# Patient Record
Sex: Female | Born: 2000 | Race: Black or African American | Hispanic: No | Marital: Single | Smoking: Never smoker
Health system: Southern US, Community
[De-identification: ages and names within clinical notes are randomized; demographics above are authoritative.]

## PROBLEM LIST (undated history)

## (undated) DIAGNOSIS — T7840XA Allergy, unspecified, initial encounter: Secondary | ICD-10-CM

## (undated) HISTORY — DX: Allergy, unspecified, initial encounter: T78.40XA

---
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
## 2018-08-31 ENCOUNTER — Other Ambulatory Visit: Payer: Self-pay | Admitting: Physician Assistant

## 2019-04-23 ENCOUNTER — Other Ambulatory Visit: Payer: Self-pay | Admitting: Physician Assistant

## 2019-05-26 ENCOUNTER — Other Ambulatory Visit: Payer: Self-pay | Admitting: Physician Assistant

## 2019-06-01 ENCOUNTER — Other Ambulatory Visit: Payer: Self-pay | Admitting: Physician Assistant

## 2019-08-28 ENCOUNTER — Other Ambulatory Visit: Payer: Self-pay | Admitting: Physician Assistant

## 2021-06-25 ENCOUNTER — Other Ambulatory Visit: Payer: Self-pay | Admitting: Physician Assistant

## 2022-02-22 ENCOUNTER — Ambulatory Visit
Admission: EM | Admit: 2022-02-22 | Discharge: 2022-02-22 | Disposition: A | Discharge status: Home / Self Care | Payer: Managed Care, Other (non HMO) | Attending: Internal Medicine | Admitting: Internal Medicine

## 2022-02-22 DIAGNOSIS — R300 Dysuria: Secondary | ICD-10-CM | POA: Diagnosis present

## 2022-02-22 DIAGNOSIS — N898 Other specified noninflammatory disorders of vagina: Secondary | ICD-10-CM

## 2022-02-22 DIAGNOSIS — Z113 Encounter for screening for infections with a predominantly sexual mode of transmission: Secondary | ICD-10-CM | POA: Diagnosis present

## 2022-02-22 LAB — POCT URINALYSIS DIP (MANUAL ENTRY)
Bilirubin, UA: NEGATIVE
Blood, UA: NEGATIVE
Glucose, UA: NEGATIVE mg/dL
Ketones, POC UA: NEGATIVE mg/dL
Nitrite, UA: NEGATIVE
Protein Ur, POC: NEGATIVE mg/dL
Spec Grav, UA: 1.025 (ref 1.010–1.025)
Urobilinogen, UA: 1 E.U./dL
pH, UA: 6 (ref 5.0–8.0)

## 2022-02-22 LAB — POCT URINE PREGNANCY: Preg Test, Ur: NEGATIVE

## 2022-02-22 MED ORDER — DOXYCYCLINE HYCLATE 100 MG PO CAPS: 100.0000 mg | ORAL_CAPSULE | Freq: Two times a day (BID) | ORAL | 0 refills | Status: AC

## 2022-02-22 MED ORDER — CEFTRIAXONE SODIUM 500 MG IJ SOLR
500.0000 mg | Freq: Once | INTRAMUSCULAR | Status: AC
  Administered 2022-02-22: 500 mg via INTRAMUSCULAR

## 2022-02-22 NOTE — ED Triage Notes (Signed)
Pt c/o dysuria, vaginal discharge (watery)  Denies frequency, hematuria, vaginal odor, pelvic pain, back pain  Onset ~ 4 days

## 2022-02-22 NOTE — Discharge Instructions (Signed)
Your urine do not show signs of urinary tract infection.  Urine pregnancy was negative.  You are being prophylactically treated for possible STD exposure with antibiotic shot in urgent care as well as antibiotic pills sent over to pharmacy.  These will treat for gonorrhea and chlamydia if you were exposed.  Your vaginal swab and urine culture pending.  We will call if they are positive and provide any further treatment if necessary.  Please refrain from sexual activity until test results and treatment are complete.

## 2022-02-22 NOTE — ED Provider Notes (Signed)
[facility]    CSN: [id number] Arrival date & time: 02/22/22  0840      History   Chief Complaint Chief Complaint  Patient presents with   Dysuria    HPI Marcia Clark is a 21 y.o. female.   Patient presents with dysuria and vaginal discharge that started about 4 days ago.  Vaginal discharge is clear to white in color.  Dysuria occurred 1 day and has now resolved.  Denies urinary frequency, hematuria, abnormal vaginal bleeding, abdominal pain, fever, pelvic pain, back pain.  Patient denies any known exposure to STD but does report that her recent sexual partner has penile discharge.  Last menstrual cycle was approximately 1 to 2 weeks ago.   Dysuria  Past Medical History:  Diagnosis Date   Allergy    Asthma     There are no problems to display for this patient.   History reviewed. No pertinent surgical history.  OB History   No obstetric history on file.      Home Medications    Prior to Admission medications   Medication Sig Start Date End Date Taking? Authorizing Provider  doxycycline (VIBRAMYCIN) 100 MG capsule Take 1 capsule (100 mg total) by mouth 2 (two) times daily. 02/22/22  Yes Deeanna Beightol, Acie FredricksonHaley E, FNP  levonorgestrel-ethinyl estradiol (NORDETTE) 0.15-30 MG-MCG tablet Take 1 tablet by mouth daily. 01/01/22  Yes [provider]  famotidine (PEPCID) 40 MG tablet Take 40 mg by mouth daily. 12/10/21   [provider]    Family History Family History  Problem Relation Age of Onset   Cancer Maternal Aunt    Hypertension Paternal Uncle    Arthritis Paternal Grandmother    Diabetes Paternal Grandmother    Hypertension Paternal Grandmother    Arthritis Paternal Grandfather    Hypertension Mother     Social History Social History   Tobacco Use   Smoking status: Passive Smoke Exposure - Never Smoker  Substance Use Topics   Alcohol use: No   Drug use: No     Allergies   Other   Review of Systems Review of Systems Per  HPI  Physical Exam Triage Vital Signs ED Triage Vitals [02/22/22 0948]  Enc Vitals Group     BP 121/87     Pulse Rate 84     Resp 18     Temp 98.6 F (37 C)     Temp Source Oral     SpO2 98 %     Weight      Height      Head Circumference      Peak Flow      Pain Score 0     Pain Loc      Pain Edu?      Excl. in [place]?    No data found.  Updated Vital Signs BP 121/87 (BP Location: Left Arm)   Pulse 84   Temp 98.6 F (37 C) (Oral)   Resp 18   SpO2 98%   Visual Acuity Right Eye Distance:   Left Eye Distance:   Bilateral Distance:    Right Eye Near:   Left Eye Near:    Bilateral Near:     Physical Exam Constitutional:      General: She is not in acute distress.    Appearance: Normal appearance. She is not toxic-appearing or diaphoretic.  HENT:     Head: Normocephalic and atraumatic.  Eyes:     Extraocular Movements: Extraocular movements intact.  Conjunctiva/sclera: Conjunctivae normal.  Cardiovascular:     Rate and Rhythm: Normal rate and regular rhythm.     Pulses: Normal pulses.     Heart sounds: Normal heart sounds.  Pulmonary:     Effort: Pulmonary effort is normal. No respiratory distress.     Breath sounds: Normal breath sounds.  Abdominal:     General: Bowel sounds are normal. There is no distension.     Palpations: Abdomen is soft.     Tenderness: There is no abdominal tenderness.  Genitourinary:    Comments: Deferred with shared decision making.  Self swab performed. Neurological:     General: No focal deficit present.     Mental Status: She is alert and oriented to person, place, and time. Mental status is at baseline.  Psychiatric:        Mood and Affect: Mood normal.        Behavior: Behavior normal.        Thought Content: Thought content normal.        Judgment: Judgment normal.     UC Treatments / Results  Labs (all labs ordered are listed, but only abnormal results are displayed) Labs Reviewed  POCT URINALYSIS DIP (MANUAL  ENTRY) - Abnormal; Notable for the following components:      Result Value   Leukocytes, UA Trace (*)    All other components within normal limits  URINE CULTURE  POCT URINE PREGNANCY    EKG   Radiology No results found.  Procedures Procedures (including critical care time)  Medications Ordered in UC Medications  cefTRIAXone (ROCEPHIN) injection 500 mg (500 mg Intramuscular Given 02/22/22 1032)    Initial Impression / Assessment and Plan / UC Course  I have reviewed the triage vital signs and the nursing notes.  Pertinent labs & imaging results that were available during my care of the patient were reviewed by me and considered in my medical decision making (see chart for details).     There is concern for STD exposure given that her sexual partner has had penile discharge and similar symptoms.  Will opt to treat prophylactically with IM Rocephin and doxycycline to cover for gonorrhea and chlamydia exposure.  Urinalysis not indicative of urinary tract infection.  Will send urine culture and cervicovaginal swab.  Will await results for any further treatment.  Patient to refrain from sexual activity until test results and treatment are complete.  Discussed return precautions.  Patient verbalized understanding and was agreeable with plan. Final Clinical Impressions(s) / UC Diagnoses   Final diagnoses:  Vaginal discharge  Screening examination for venereal disease  Dysuria     Discharge Instructions      Your urine do not show signs of urinary tract infection.  Urine pregnancy was negative.  You are being prophylactically treated for possible STD exposure with antibiotic shot in urgent care as well as antibiotic pills sent over to pharmacy.  These will treat for gonorrhea and chlamydia if you were exposed.  Your vaginal swab and urine culture pending.  We will call if they are positive and provide any further treatment if necessary.  Please refrain from sexual activity until test  results and treatment are complete.    ED Prescriptions     Medication Sig Dispense Auth. Provider   doxycycline (VIBRAMYCIN) 100 MG capsule Take 1 capsule (100 mg total) by mouth 2 (two) times daily. 20 capsule Gustavus Bryant, [place]      PDMP not reviewed this encounter.   Parkin,  Acie Fredrickson, FNP 02/22/22 1035

## 2022-02-23 LAB — URINE CULTURE: Culture: <10000 — AB

## 2022-02-24 LAB — CERVICOVAGINAL ANCILLARY ONLY
Bacterial Vaginitis (gardnerella): NEGATIVE
Candida Glabrata: NEGATIVE
Candida Vaginitis: POSITIVE — AB
Chlamydia: NEGATIVE
Comment: NEGATIVE
Comment: NEGATIVE
Comment: NEGATIVE
Comment: NEGATIVE
Comment: NEGATIVE
Comment: NORMAL
Neisseria Gonorrhea: NEGATIVE
Trichomonas: NEGATIVE

## 2022-02-25 ENCOUNTER — Telehealth (HOSPITAL_COMMUNITY): Payer: Self-pay | Admitting: Emergency Medicine

## 2022-02-25 MED ORDER — FLUCONAZOLE 150 MG PO TABS: 150.0000 mg | ORAL_TABLET | Freq: Once | ORAL | 0 refills | Status: AC

## 2022-07-25 ENCOUNTER — Encounter: Payer: Self-pay | Admitting: Emergency Medicine

## 2022-07-25 ENCOUNTER — Ambulatory Visit
Admission: EM | Admit: 2022-07-25 | Discharge: 2022-07-25 | Disposition: A | Discharge status: Home / Self Care | Payer: Managed Care, Other (non HMO) | Attending: Physician Assistant | Admitting: Physician Assistant

## 2022-07-25 DIAGNOSIS — N760 Acute vaginitis: Secondary | ICD-10-CM | POA: Insufficient documentation

## 2022-07-25 DIAGNOSIS — Z1152 Encounter for screening for COVID-19: Secondary | ICD-10-CM | POA: Diagnosis present

## 2022-07-25 DIAGNOSIS — R52 Pain, unspecified: Secondary | ICD-10-CM | POA: Insufficient documentation

## 2022-07-25 NOTE — ED Provider Notes (Signed)
[facility]    CSN: [id number] Arrival date & time: 07/25/22  1522      History   Chief Complaint Chief Complaint  Patient presents with   Generalized Body Aches   hot sweats    Laceration    HPI Marcia Clark is a 21 y.o. female.   Patient here today for evaluation of generalized body aches and hot sweats.  She denies any congestion, cough or sore throat.  She has not had any vomiting or diarrhea.  She has not taken any medication for symptoms.  She also reports a "cut" to the bottom of her vaginal opening.  She states this occurred during sex about a week ago.  She notes she has had "tears "in the past but nothing that has not healed within a few days.  The history is provided by the patient.  Laceration Associated symptoms: no fever     Past Medical History:  Diagnosis Date   Allergy    Asthma     There are no problems to display for this patient.   History reviewed. No pertinent surgical history.  OB History   No obstetric history on file.      Home Medications    Prior to Admission medications   Medication Sig Start Date End Date Taking? Authorizing Provider  doxycycline (VIBRAMYCIN) 100 MG capsule Take 1 capsule (100 mg total) by mouth 2 (two) times daily. 02/22/22   Teodora Medici, FNP  famotidine (PEPCID) 40 MG tablet Take 40 mg by mouth daily. 12/10/21   [provider]  levonorgestrel-ethinyl estradiol (NORDETTE) 0.15-30 MG-MCG tablet Take 1 tablet by mouth daily. 01/01/22   [provider]    Family History Family History  Problem Relation Age of Onset   Cancer Maternal Aunt    Hypertension Paternal Uncle    Arthritis Paternal Grandmother    Diabetes Paternal Grandmother    Hypertension Paternal Grandmother    Arthritis Paternal Grandfather    Hypertension Mother     Social History Social History   Tobacco Use   Smoking status: Passive Smoke Exposure - Never Smoker  Substance Use Topics   Alcohol use: No    Drug use: No     Allergies   Other   Review of Systems Review of Systems  Constitutional:  Positive for diaphoresis. Negative for chills and fever.  HENT:  Negative for congestion and sore throat.   Eyes:  Negative for discharge and redness.  Respiratory:  Negative for cough and shortness of breath.   Gastrointestinal:  Negative for abdominal pain, diarrhea and vomiting.  Musculoskeletal:  Negative for myalgias.  Skin:  Negative for wound.     Physical Exam Triage Vital Signs ED Triage Vitals  Enc Vitals Group     BP 07/25/22 1555 124/83     Pulse Rate 07/25/22 1555 (!) 105     Resp 07/25/22 1555 18     Temp 07/25/22 1555 98.3 F (36.8 C)     Temp src --      SpO2 07/25/22 1555 98 %     Weight --      Height --      Head Circumference --      Peak Flow --      Pain Score 07/25/22 1553 0     Pain Loc --      Pain Edu? --      Excl. in [place]? --    No data found.  Updated Vital Signs  BP 124/83   Pulse (!) 105   Temp 98.3 F (36.8 C)   Resp 18   SpO2 98%      Physical Exam Vitals and nursing note reviewed.  Constitutional:      General: She is not in acute distress.    Appearance: Normal appearance. She is not ill-appearing.  HENT:     Head: Normocephalic and atraumatic.     Nose: Nose normal. No congestion or rhinorrhea.  Eyes:     Conjunctiva/sclera: Conjunctivae normal.  Cardiovascular:     Rate and Rhythm: Normal rate.  Pulmonary:     Effort: Pulmonary effort is normal. No respiratory distress.  Genitourinary:    Comments: Mild skin tear to lower vaginal introitus  Neurological:     Mental Status: She is alert.  Psychiatric:        Mood and Affect: Mood normal.        Behavior: Behavior normal.        Thought Content: Thought content normal.      UC Treatments / Results  Labs (all labs ordered are listed, but only abnormal results are displayed) Labs Reviewed  SARS CORONAVIRUS 2 (TAT 6-24 HRS)    EKG   Radiology No results  found.  Procedures Procedures (including critical care time)  Medications Ordered in UC Medications - No data to display  Initial Impression / Assessment and Plan / UC Course  I have reviewed the triage vital signs and the nursing notes.  Pertinent labs & imaging results that were available during my care of the patient were reviewed by me and considered in my medical decision making (see chart for details).    Will screen for covid given body aches- concerns for viral illness. Recommended symptomatic treatment. Discussed avoiding sexual activity until skin tear heals- advised vaseline to help with comfort. Encouraged follow up with any further concerns.    Final Clinical Impressions(s) / UC Diagnoses   Final diagnoses:  Body aches  Inflammation of vaginal introitus  Encounter for screening for COVID-19   Discharge Instructions   None    ED Prescriptions   None    PDMP not reviewed this encounter.   Francene Finders, PA-C 07/25/22 [phone]

## 2022-07-25 NOTE — ED Triage Notes (Signed)
Pt is present today with body aches and hot sweats. Pt sx started x3 days ago.   Pt also states that she has a near her vaginal canal. Pt states that she noticed the cut last Wednesday

## 2022-07-26 LAB — SARS CORONAVIRUS 2 (TAT 6-24 HRS): SARS Coronavirus 2: NEGATIVE

## 2022-07-27 ENCOUNTER — Ambulatory Visit (HOSPITAL_COMMUNITY)
Admission: EM | Admit: 2022-07-27 | Discharge: 2022-07-27 | Disposition: A | Discharge status: Home / Self Care | Payer: Managed Care, Other (non HMO) | Attending: Emergency Medicine | Admitting: Emergency Medicine

## 2022-07-27 ENCOUNTER — Encounter (HOSPITAL_COMMUNITY): Payer: Self-pay

## 2022-07-27 DIAGNOSIS — N898 Other specified noninflammatory disorders of vagina: Secondary | ICD-10-CM | POA: Diagnosis present

## 2022-07-27 DIAGNOSIS — N9089 Other specified noninflammatory disorders of vulva and perineum: Secondary | ICD-10-CM | POA: Insufficient documentation

## 2022-07-27 LAB — CBC WITH DIFFERENTIAL/PLATELET
Abs Immature Granulocytes: 0 10*3/uL (ref 0.00–0.07)
Basophils Absolute: 0 10*3/uL (ref 0.0–0.1)
Basophils Relative: 0 %
Eosinophils Absolute: 0 10*3/uL (ref 0.0–0.5)
Eosinophils Relative: 0 %
HCT: 38.9 % (ref 36.0–46.0)
Hemoglobin: 13.4 g/dL (ref 12.0–15.0)
Lymphocytes Relative: 29 %
Lymphs Abs: 1.1 10*3/uL (ref 0.7–4.0)
MCH: 31 pg (ref 26.0–34.0)
MCHC: 34.4 g/dL (ref 30.0–36.0)
MCV: 90 fL (ref 80.0–100.0)
Monocytes Absolute: 0.4 10*3/uL (ref 0.1–1.0)
Monocytes Relative: 11 %
Neutro Abs: 2.3 10*3/uL (ref 1.7–7.7)
Neutrophils Relative %: 60 %
Platelets: 199 10*3/uL (ref 150–400)
RBC: 4.32 MIL/uL (ref 3.87–5.11)
RDW: 12.8 % (ref 11.5–15.5)
WBC: 3.8 10*3/uL — ABNORMAL LOW (ref 4.0–10.5)
nRBC: 0 % (ref 0.0–0.2)
nRBC: 0 /100 WBC

## 2022-07-27 LAB — POCT URINALYSIS DIPSTICK, ED / UC
Glucose, UA: NEGATIVE mg/dL
Hgb urine dipstick: NEGATIVE
Leukocytes,Ua: NEGATIVE
Nitrite: NEGATIVE
Protein, ur: 30 mg/dL — AB
Specific Gravity, Urine: 1.02 (ref 1.005–1.030)
Urobilinogen, UA: 2 mg/dL — ABNORMAL HIGH (ref 0.0–1.0)
pH: 6 (ref 5.0–8.0)

## 2022-07-27 LAB — POC URINE PREG, ED: Preg Test, Ur: NEGATIVE

## 2022-07-27 MED ORDER — NAPROXEN 500 MG PO TABS: 500.0000 mg | ORAL_TABLET | Freq: Two times a day (BID) | ORAL | 0 refills | Status: AC
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
## 2022-07-27 NOTE — ED Triage Notes (Signed)
Pt reports body ache and eye swelling x-2-3 days. Pt reports a cut on her vagina. This is due to sexual intercourse last week.

## 2022-07-27 NOTE — Discharge Instructions (Addendum)
Your vaginal swab and HSV swab are currently pending.  If the test results are positive we will give you a call.  The blood work is currently pending, if anything is abnormal with the blood work we will give you a call.  I have attached information for sitz bath, I recommend that you performing sitz bath at least 2 times daily.  At this time is best that you refrain from any sexual activity for the next 3 to 4 weeks.  I have attached information for the women's health care center, please call them tomorrow and schedule an appointment.   I have sent naproxen to the pharmacy, this is an anti-inflammatory medication, you can take this every 12 hours as needed.  I recommend that you have food on your stomach when you take this medication due to potential stomach upset and/or GI bleeding that can be caused by the medication.

## 2022-07-27 NOTE — ED Notes (Signed)
Chaperoned Ngozi, NP at patient bedside and examination of genital area.  Provider obtained a cytology swab and HSV swab.  Both labeled at bedside verified patient name and birth date.  Samples placed in lab

## 2022-07-28 LAB — CERVICOVAGINAL ANCILLARY ONLY
Bacterial Vaginitis (gardnerella): POSITIVE — AB
Candida Glabrata: NEGATIVE
Candida Vaginitis: NEGATIVE
Chlamydia: NEGATIVE
Comment: NEGATIVE
Comment: NEGATIVE
Comment: NEGATIVE
Comment: NEGATIVE
Comment: NEGATIVE
Comment: NORMAL
Neisseria Gonorrhea: NEGATIVE
Trichomonas: NEGATIVE

## 2022-07-29 ENCOUNTER — Telehealth (HOSPITAL_COMMUNITY): Payer: Self-pay | Admitting: Emergency Medicine

## 2022-07-29 LAB — HSV CULTURE AND TYPING

## 2022-07-29 MED ORDER — METRONIDAZOLE 500 MG PO TABS: 500.0000 mg | ORAL_TABLET | Freq: Two times a day (BID) | ORAL | 0 refills | Status: DC

## 2022-10-31 ENCOUNTER — Ambulatory Visit
Admission: RE | Admit: 2022-10-31 | Discharge: 2022-10-31 | Disposition: A | Discharge status: Home / Self Care | Payer: Managed Care, Other (non HMO) | Source: Ambulatory Visit | Attending: Internal Medicine | Admitting: Internal Medicine

## 2022-10-31 VITALS — BP 122/83 | HR 94 | Temp 98.7°F | Resp 20 | Ht 67.0 in | Wt 180.1 lb

## 2022-10-31 DIAGNOSIS — Z3202 Encounter for pregnancy test, result negative: Secondary | ICD-10-CM | POA: Diagnosis present

## 2022-10-31 DIAGNOSIS — Z113 Encounter for screening for infections with a predominantly sexual mode of transmission: Secondary | ICD-10-CM

## 2022-10-31 DIAGNOSIS — N898 Other specified noninflammatory disorders of vagina: Secondary | ICD-10-CM | POA: Insufficient documentation

## 2022-10-31 DIAGNOSIS — A6004 Herpesviral vulvovaginitis: Secondary | ICD-10-CM | POA: Diagnosis present

## 2022-10-31 LAB — POCT URINE PREGNANCY: Preg Test, Ur: NEGATIVE

## 2022-10-31 MED ORDER — VALACYCLOVIR HCL 500 MG PO TABS: 500.0000 mg | ORAL_TABLET | Freq: Two times a day (BID) | ORAL | 0 refills | Status: AC

## 2022-10-31 NOTE — ED Provider Notes (Signed)
[facility]    CSN: [id number] Arrival date & time: 10/31/22  1108      History   Chief Complaint Chief Complaint  Patient presents with   Exposure to STD    Entered by patient    HPI Marcia Clark is a 22 y.o. female.   Patient presents with a chief complaint of yellow vaginal discharge and vaginal odor that has been present for 2 days.  Patient denies any dysuria, urinary frequency, abdominal pain, back pain, fever, hematuria, abnormal vaginal bleeding.  Last menstrual cycle was 10/13/2022.  Patient does report that she thinks that she has some type of vaginal lesion on the right side of the vaginal area.  She is concerned for genital herpes flareup given that she was diagnosed with this in October 2023.  Denies any recent flareups since.  Denies any exposure to STD.  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   Exposure to STD    Past Medical History:  Diagnosis Date   Allergy    Asthma     There are no problems to display for this patient.   History reviewed. No pertinent surgical history.  OB History   No obstetric history on file.      Home Medications    Prior to Admission medications   Medication Sig Start Date End Date Taking? Authorizing Provider  famotidine (PEPCID) 40 MG tablet Take 40 mg by mouth daily. 12/10/21  Yes [provider]  levonorgestrel-ethinyl estradiol (NORDETTE) 0.15-30 MG-MCG tablet Take 1 tablet by mouth daily. 01/01/22  Yes [provider]  valACYclovir (VALTREX) 500 MG tablet Take 1 tablet (500 mg total) by mouth 2 (two) times daily for 3 days. 10/31/22 11/03/22 Yes Ameira Alessandrini, Acie Fredrickson, FNP  doxycycline (VIBRAMYCIN) 100 MG capsule Take 1 capsule (100 mg total) by mouth 2 (two) times daily. 02/22/22   Gustavus Bryant, FNP  metroNIDAZOLE (FLAGYL) 500 MG tablet Take 1 tablet (500 mg total) by mouth 2 (two) times daily. 07/29/22   Lamptey, Britta Mccreedy, MD  naproxen (NAPROSYN) 500 MG tablet Take 1 tablet (500  mg total) by mouth 2 (two) times daily. 07/27/22   Debby Freiberg, NP    Family History Family History  Problem Relation Age of Onset   Cancer Maternal Aunt    Hypertension Paternal Uncle    Arthritis Paternal Grandmother    Diabetes Paternal Grandmother    Hypertension Paternal Grandmother    Arthritis Paternal Grandfather    Hypertension Mother     Social History Social History   Tobacco Use   Smoking status: Never    Passive exposure: Yes  Substance Use Topics   Alcohol use: Yes   Drug use: No     Allergies   Other and Omeprazole   Review of Systems Review of Systems Per HPI  Physical Exam Triage Vital Signs ED Triage Vitals  Enc Vitals Group     BP 10/31/22 1150 122/83     Pulse Rate 10/31/22 1150 94     Resp 10/31/22 1150 20     Temp 10/31/22 1150 98.7 F (37.1 C)     Temp Source 10/31/22 1150 Oral     SpO2 10/31/22 1150 98 %     Weight 10/31/22 1148 180 lb 1.6 oz (81.7 kg)     Height 10/31/22 1148 5\' 7"  (1.702 m)     Head Circumference --      Peak Flow --      Pain Score 10/31/22  1148 0     Pain Loc --      Pain Edu? --      Excl. in [place]? --    No data found.  Updated Vital Signs BP 122/83 (BP Location: Left Arm)   Pulse 94   Temp 98.7 F (37.1 C) (Oral)   Resp 20   Ht 5\' 7"  (1.702 m)   Wt 180 lb 1.6 oz (81.7 kg)   LMP 10/18/2022   SpO2 98%   BMI 28.21 kg/m   Visual Acuity Right Eye Distance:   Left Eye Distance:   Bilateral Distance:    Right Eye Near:   Left Eye Near:    Bilateral Near:     Physical Exam Exam conducted with a chaperone present.  Constitutional:      General: She is not in acute distress.    Appearance: Normal appearance. She is not toxic-appearing or diaphoretic.  HENT:     Head: Normocephalic and atraumatic.  Eyes:     Extraocular Movements: Extraocular movements intact.     Conjunctiva/sclera: Conjunctivae normal.  Pulmonary:     Effort: Pulmonary effort is normal.  Genitourinary:       Comments: Vesicular-like lesion that is open present to right outer labia.  No drainage noted. Neurological:     General: No focal deficit present.     Mental Status: She is alert and oriented to person, place, and time. Mental status is at baseline.  Psychiatric:        Mood and Affect: Mood normal.        Behavior: Behavior normal.        Thought Content: Thought content normal.        Judgment: Judgment normal.      UC Treatments / Results  Labs (all labs ordered are listed, but only abnormal results are displayed) Labs Reviewed  POCT URINE PREGNANCY  CERVICOVAGINAL ANCILLARY ONLY    EKG   Radiology No results found.  Procedures Procedures (including critical care time)  Medications Ordered in UC Medications - No data to display  Initial Impression / Assessment and Plan / UC Course  I have reviewed the triage vital signs and the nursing notes.  Pertinent labs & imaging results that were available during my care of the patient were reviewed by me and considered in my medical decision making (see chart for details).     Vaginal lesion appears consistent with genital herpes flareup.  Will treat with Valtrex.  Cervicovaginal swab pending for vaginal discharge.  Given no exposure to STD, will await results for further treatment.  Patient was advised it will be best to follow-up with gynecology at provided contact information given history of recurrent bacterial vaginosis and genital herpes monitoring.  Patient to refrain from sexual activity until test results and treatment are complete.  Discussed safe sex practices.  Discussed return precautions.  Patient verbalized understanding and was agreeable with plan. Final Clinical Impressions(s) / UC Diagnoses   Final diagnoses:  Herpes simplex vulvovaginitis  Screening examination for venereal disease  Vaginal discharge  Urine pregnancy test negative     Discharge Instructions      I am treating you with Valtrex for  genital herpes flareup.  Your vaginal swab is pending.  We will call if it is abnormal and treat as appropriate.  I recommend that you follow-up with gynecology at provided contact information for further evaluation and management.  Urine pregnancy was negative.    ED Prescriptions  Medication Sig Dispense Auth. Provider   valACYclovir (VALTREX) 500 MG tablet Take 1 tablet (500 mg total) by mouth 2 (two) times daily for 3 days. 6 tablet [place], Michele Rockers, [place]      PDMP not reviewed this encounter.   Teodora Medici, [place] 10/31/22 1237

## 2022-10-31 NOTE — ED Triage Notes (Signed)
Pt states that she has vaginal discharge and vaginal odor. X2 days 

## 2022-10-31 NOTE — Discharge Instructions (Signed)
I am treating you with Valtrex for genital herpes flareup.  Your vaginal swab is pending.  We will call if it is abnormal and treat as appropriate.  I recommend that you follow-up with gynecology at provided contact information for further evaluation and management.  Urine pregnancy was negative.

## 2022-11-03 ENCOUNTER — Telehealth (HOSPITAL_COMMUNITY): Payer: Self-pay | Admitting: Emergency Medicine

## 2022-11-03 LAB — CERVICOVAGINAL ANCILLARY ONLY
Bacterial Vaginitis (gardnerella): POSITIVE — AB
Candida Glabrata: NEGATIVE
Candida Vaginitis: POSITIVE — AB
Chlamydia: NEGATIVE
Comment: NEGATIVE
Comment: NEGATIVE
Comment: NEGATIVE
Comment: NEGATIVE
Comment: NEGATIVE
Comment: NORMAL
Neisseria Gonorrhea: NEGATIVE
Trichomonas: NEGATIVE

## 2022-11-03 MED ORDER — METRONIDAZOLE 0.75 % VA GEL: 1.0000 | Freq: Every day | VAGINAL | 0 refills | Status: AC

## 2022-11-03 MED ORDER — FLUCONAZOLE 150 MG PO TABS: 150.0000 mg | ORAL_TABLET | Freq: Once | ORAL | 0 refills | Status: AC

## 2023-01-26 ENCOUNTER — Ambulatory Visit (INDEPENDENT_AMBULATORY_CARE_PROVIDER_SITE_OTHER): Payer: Managed Care, Other (non HMO)

## 2023-01-26 ENCOUNTER — Ambulatory Visit (INDEPENDENT_AMBULATORY_CARE_PROVIDER_SITE_OTHER): Payer: Managed Care, Other (non HMO) | Admitting: Podiatry

## 2023-01-26 ENCOUNTER — Encounter: Payer: Self-pay | Admitting: Podiatry

## 2023-01-26 DIAGNOSIS — M79675 Pain in left toe(s): Secondary | ICD-10-CM

## 2023-01-26 DIAGNOSIS — B351 Tinea unguium: Secondary | ICD-10-CM

## 2023-01-26 DIAGNOSIS — L600 Ingrowing nail: Secondary | ICD-10-CM

## 2023-01-27 NOTE — Progress Notes (Signed)
Subjective:   Patient ID: Marcia Clark, female   DOB: 22 y.o.   MRN: [id number]   HPI Patient presents concerned about structural nail disease and structural problems with her bone structure stating that her mom has had this and she is very concerned about developing the same condition.  Patient does not smoke likes to be active   Review of Systems  All other systems reviewed and are negative.       Objective:  Physical Exam Vitals and nursing note reviewed.  Constitutional:      Appearance: She is well-developed.  Pulmonary:     Effort: Pulmonary effort is normal.  Musculoskeletal:        General: Normal range of motion.  Skin:    General: Skin is warm.  Neurological:     Mental Status: She is alert.     Neurovascular status intact muscle strength adequate range of motion within normal limits with patient found to have moderate changes in nail beds with moderate deformity and mild structural deformity noted bilateral.  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  At this time she should continue to cut the nail straight across and she is encouraged to call us if any issues were to occur  X-rays indicate mild structural deformity no indications though of advancement or anything of the treatable nature at this time

## 2023-02-03 ENCOUNTER — Other Ambulatory Visit: Payer: Self-pay | Admitting: Podiatry

## 2023-02-03 DIAGNOSIS — M79675 Pain in left toe(s): Secondary | ICD-10-CM

## 2023-02-03 DIAGNOSIS — B351 Tinea unguium: Secondary | ICD-10-CM

## 2023-02-03 DIAGNOSIS — L600 Ingrowing nail: Secondary | ICD-10-CM
